# Patient Record
Sex: Male | Born: 1945 | Race: White | Hispanic: No | Smoking: Former smoker
Health system: Southern US, Community
[De-identification: ages and names within clinical notes are randomized; demographics above are authoritative.]

## PROBLEM LIST (undated history)

## (undated) DIAGNOSIS — E7800 Pure hypercholesterolemia, unspecified: Secondary | ICD-10-CM

## (undated) DIAGNOSIS — J9611 Chronic respiratory failure with hypoxia: Secondary | ICD-10-CM

## (undated) DIAGNOSIS — J449 Chronic obstructive pulmonary disease, unspecified: Secondary | ICD-10-CM

## (undated) DIAGNOSIS — G4733 Obstructive sleep apnea (adult) (pediatric): Secondary | ICD-10-CM

## (undated) DIAGNOSIS — E119 Type 2 diabetes mellitus without complications: Secondary | ICD-10-CM

## (undated) DIAGNOSIS — K219 Gastro-esophageal reflux disease without esophagitis: Secondary | ICD-10-CM

## (undated) HISTORY — PX: STOMACH SURGERY: SHX791

## (undated) HISTORY — DX: Gastro-esophageal reflux disease without esophagitis: K21.9

## (undated) HISTORY — DX: Type 2 diabetes mellitus without complications: E11.9

## (undated) HISTORY — DX: Chronic respiratory failure with hypoxia: J96.11

## (undated) HISTORY — DX: Obstructive sleep apnea (adult) (pediatric): G47.33

## (undated) HISTORY — DX: Chronic obstructive pulmonary disease, unspecified: J44.9

## (undated) HISTORY — DX: Pure hypercholesterolemia, unspecified: E78.00

---

## 2015-05-01 ENCOUNTER — Other Ambulatory Visit: Payer: Self-pay

## 2015-05-01 ENCOUNTER — Encounter: Payer: Self-pay | Admitting: Pulmonary Disease

## 2015-05-03 ENCOUNTER — Telehealth: Payer: Self-pay | Admitting: Pulmonary Disease

## 2015-05-03 NOTE — Telephone Encounter (Signed)
No data to review.

## 2015-05-04 ENCOUNTER — Ambulatory Visit (INDEPENDENT_AMBULATORY_CARE_PROVIDER_SITE_OTHER): Payer: No Typology Code available for payment source | Admitting: Pulmonary Disease

## 2015-05-04 ENCOUNTER — Other Ambulatory Visit: Payer: No Typology Code available for payment source

## 2015-05-04 ENCOUNTER — Ambulatory Visit (INDEPENDENT_AMBULATORY_CARE_PROVIDER_SITE_OTHER)
Admission: RE | Admit: 2015-05-04 | Discharge: 2015-05-04 | Disposition: A | Discharge status: Home / Self Care | Payer: No Typology Code available for payment source | Source: Ambulatory Visit | Attending: Pulmonary Disease | Admitting: Pulmonary Disease

## 2015-05-04 ENCOUNTER — Encounter (INDEPENDENT_AMBULATORY_CARE_PROVIDER_SITE_OTHER): Payer: Self-pay

## 2015-05-04 ENCOUNTER — Encounter: Payer: Self-pay | Admitting: Pulmonary Disease

## 2015-05-04 ENCOUNTER — Telehealth: Payer: Self-pay | Admitting: Pulmonary Disease

## 2015-05-04 VITALS — BP 124/70 | HR 100 | Temp 96.9°F | Ht 72.0 in | Wt 202.8 lb

## 2015-05-04 DIAGNOSIS — J449 Chronic obstructive pulmonary disease, unspecified: Secondary | ICD-10-CM | POA: Diagnosis not present

## 2015-05-04 DIAGNOSIS — Z77090 Contact with and (suspected) exposure to asbestos: Secondary | ICD-10-CM

## 2015-05-04 DIAGNOSIS — G4733 Obstructive sleep apnea (adult) (pediatric): Secondary | ICD-10-CM | POA: Insufficient documentation

## 2015-05-04 DIAGNOSIS — E119 Type 2 diabetes mellitus without complications: Secondary | ICD-10-CM | POA: Insufficient documentation

## 2015-05-04 DIAGNOSIS — K219 Gastro-esophageal reflux disease without esophagitis: Secondary | ICD-10-CM | POA: Insufficient documentation

## 2015-05-04 DIAGNOSIS — E785 Hyperlipidemia, unspecified: Secondary | ICD-10-CM | POA: Insufficient documentation

## 2015-05-04 DIAGNOSIS — J9610 Chronic respiratory failure, unspecified whether with hypoxia or hypercapnia: Secondary | ICD-10-CM | POA: Insufficient documentation

## 2015-05-04 DIAGNOSIS — J9611 Chronic respiratory failure with hypoxia: Secondary | ICD-10-CM | POA: Diagnosis not present

## 2015-05-04 DIAGNOSIS — Z9989 Dependence on other enabling machines and devices: Secondary | ICD-10-CM

## 2015-05-04 DIAGNOSIS — I4891 Unspecified atrial fibrillation: Secondary | ICD-10-CM | POA: Insufficient documentation

## 2015-05-04 NOTE — Telephone Encounter (Signed)
LABS 02/23/15 CBC:  14.6 / 16.1 / 48.8 / 296 BMP:  136/4.5/101/26/13/245  02/21/15 POC Glucose:  290 POC Creatinine:  0.7 UA:  Clear / SG 1.025 / pH 6.0 / WBC <1 / RBC 1  02/17/15 BNP:  69.63 PSA:  1.12 INR:  2.1  12/24/14 INR:  1.6

## 2015-05-04 NOTE — Patient Instructions (Addendum)
   Continue using your inhalers as prescribed  Continue using your oxygen as prescribed  We will schedule a breathing and walking test at your next appointment  Please call my office if you have any new breathing problems before your next appointment which we will schedule for 3 months from now.  TESTS ORDERED: 1. Alpha-1 Antitrypsin Phenotype today 2. CXR PA/LAT today 3. Full PFTs at next appointment 4. Six-Minute Walk Tests w/ Oxygen Titration at next appointment 5. Referral to Lung Cancer Screening Coordinator Evette Doffing(Sarah G.) 6. Pulmonary Rehab Referral

## 2015-05-04 NOTE — Progress Notes (Signed)
Subjective:    Patient ID: Scott Wall, male    DOB: 09/08/1945, 70 y.o.   MRN: [id number]  HPI He reports he was diagnosed with COPD after a breathing test a few years ago. He reports he has been on oxygen since June 2016 after he was hospitalized with organ failure. He admits he is a mouth breather. Denies any history of recurrent bronchitis. He has had pneumonia once previously 8 years ago. Denies any history of childhood asthma. Denies any allergies, sinus congestion, or drainage. Denies any coughing or wheezing. Denies any history of recurrent exacerbations treated with steroids or antibiotics. He does have dyspnea on exertion only. Denies any chest pain or pressure. Denies any palpitations. Denies any recent reflux or dyspepsia but does have a history of it prior to some abdominal surgery. He is currently on Spiriva Respimat and has been on it for 5 months. Reports he has never needed to use his rescue inhaler. His physician thought he may benefit from Pulmonary Rehab so he was referred to our office.   Review of Systems No fever or sweats. He reports he chronically stays "cold". No adenopathy in his neck, groin or axilla. A pertinent 14 point review of systems is negative except as per the history of presenting illness.  No Known Allergies  Current Outpatient Prescriptions on File Prior to Visit  Medication Sig Dispense Refill  . albuterol (PROVENTIL HFA;VENTOLIN HFA) 108 (90 Base) MCG/ACT inhaler Inhale 2 puffs into the lungs every 6 (six) hours as needed for wheezing or shortness of breath.    Marland Kitchen. apixaban (ELIQUIS) 5 MG TABS tablet Take 5 mg by mouth 2 (two) times daily.    Marland Kitchen. atorvastatin (LIPITOR) 20 MG tablet Take 10 mg by mouth daily.     . carboxymethylcellulose (REFRESH PLUS) 0.5 % SOLN 1 drop 3 (three) times daily as needed.    . digoxin (LANOXIN) 0.25 MG tablet Take 0.25 mg by mouth daily.    Marland Kitchen. diltiazem (DILACOR XR) 240 MG 24 hr capsule Take 240 mg by mouth daily.    .  furosemide (LASIX) 40 MG tablet Take 40 mg by mouth 2 (two) times daily.     . hydrocortisone 2.5 % cream Apply 1 application topically 2 (two) times daily as needed.    . potassium chloride SA (K-DUR,KLOR-CON) 20 MEQ tablet Take 20 mEq by mouth daily.    . ranitidine (ZANTAC) 150 MG capsule Take 150 mg by mouth 2 (two) times daily.    . Tiotropium Bromide-Olodaterol 2.5-2.5 MCG/ACT AERS Inhale 2 puffs into the lungs daily.    . trospium (SANCTURA) 20 MG tablet Take 20 mg by mouth at bedtime. Reported on 05/04/2015     No current facility-administered medications on file prior to visit.    Past Medical History  Diagnosis Date  . COPD (chronic obstructive pulmonary disease) (HCC)   . Diabetes (HCC)   . Hypercholesteremia   . OSA (obstructive sleep apnea)   . GERD (gastroesophageal reflux disease)   . Chronic respiratory failure with hypoxia [facility](HCC)     Past Surgical History  Procedure Laterality Date  . Stomach surgery      Family History  Problem Relation Age of Onset  . Diabetes Father   . Cancer Maternal Grandmother   . Lung disease Neg Hx     Social History   Social History  . Marital Status: Divorced    Spouse Name: N/A  . Number of Children: N/A  . Years of Education:  N/A   Occupational History  . Semi-Retired     [employer]   Social History Main Topics  . Smoking status: Former Smoker -- 1.50 packs/day for 45 years    Types: Cigarettes    Quit date: 02/01/2007  . Smokeless tobacco: Never Used  . Alcohol Use: No  . Drug Use: No  . Sexual Activity: Not Asked   Other Topics Concern  . None   Social History Narrative   Originally from [place]. Grew up in [place]. Moved to [place] in 1961. He served in [employer] for 20 years. He was in [place]. 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 Previously has lived in [place], [place], [place], [place]. No  pets currently. No bird or mold exposure. Does have prior exposure to asbestos.       Objective:   Physical Exam BP 124/70 mmHg  Pulse 100  Temp(Src) 96.9 F (36.1 C) (Oral)  Ht 6' (1.829 m)  Wt 202 lb 12.8 oz (91.989 kg)  BMI 27.50 kg/m2  SpO2 91% General:  Awake. Alert. No acute distress.   Integument:  Warm & dry. No rash on exposed skin. No bruising. Lymphatics:  No appreciated cervical or supraclavicular lymphadenoapthy. HEENT:  Moist mucus membranes. No oral ulcers. No scleral injection or icterus. Rhinophyma noted. Cardiovascular:  Regular rate. No edema. No appreciable JVD.  Pulmonary:  Good aeration & clear to auscultation bilaterally. Symmetric chest wall expansion. No accessory muscle use. Abdomen: Soft. Normal bowel sounds. Nondistended. Grossly nontender. Musculoskeletal:  Normal bulk and tone. Hand grip strength 5/5 bilaterally. No joint deformity or effusion appreciated. Neurological:  CN 2-12 grossly in tact. No meningismus. Moving all 4 extremities equally. Symmetric brachioradialis deep tendon reflexes. Psychiatric:  Mood and affect congruent. Speech normal rhythm, rate & tone.   LABS 02/23/15 CBC: 14.6 / 16.1 / 48.8 / 296 BMP: 136/4.5/101/26/13/245  02/21/15 POC Glucose: 290 POC Creatinine: 0.7 UA: Clear / SG 1.025 / pH 6.0 / WBC <1 / RBC 1  02/17/15 BNP: 69.63 PSA: 1.12 INR: 2.1  12/24/14 INR: 1.6    Assessment & Plan:  70 year old male with known COPD on chronic oxygen therapy.The severity of the patient's underlying COPD is unclear at this time. He does chronically use oxygen and is compliant. He is also extremely compliant with his home CPAP therapy for OSA treatment. I suspect his COPD is likely secured to his chronic tobacco use but certainly screening for alpha-1 antitrypsin deficiency is reasonable. He does not seem to have the chronic bronchitis phenotype of underlying COPD. With his prior exposure to asbestos I feel that screening for lung  cancer is essential as well as screening for asbestosis with chest x-ray imaging today. I instructed the patient contact my office if he had any new breathing problems or questions before his next appointment.  1. COPD: Screening for alpha-1 antitrypsin deficiency today. Continuing patient on Spiriva Respimat. Obtaining pulmonary function testing from his primary care physician's office. Checking full pulmonary function testing at follow-up appointment. Referring patient to pulmonary rehabilitation. 2. Chronic hypoxic respiratory failure: Continuing patient on oxygen therapy as previously prescribed. Checking 6 minute walk test with oxygen titration at next appointment. 3. GERD: Currently asymptomatic on Zantac. No changes. 4. OSA: Patient continuing on CPAP therapy indefinitely. Reports good compliance. 5. History of asbestos exposure: Checking chest x-ray PA/LAT today & full pulmonary  function testing at next appointment. 6. History of tobacco use: Patient willing to discuss low-dose chest CT scan for lung cancer screening. Referring to her lung cancer screening coordinator. 7. Follow-up: Patient to return to clinic in 3 months or sooner if needed.  Donna Christen Jamison Neighbor, M.D. [facility] Pulmonary & Critical Care Pager:  [phone] After 3pm or if no response, call [phone] 2:16 PM 05/04/2015

## 2015-05-05 ENCOUNTER — Telehealth: Payer: Self-pay | Admitting: Pulmonary Disease

## 2015-05-05 NOTE — Telephone Encounter (Signed)
Notes Recorded by Roslynn AmbleJennings E Nestor, MD on 05/04/2015 at 5:54 PM Please call Mr. Guadlupe SpanishGroves and let him know I looked at his chest x-ray. There is suggestion of emphysema on the x-ray but otherwise no evidence of asbestosis. Thank you. --------------------------- Spoke with pt, aware of results/recs.  Nothing further needed.

## 2015-05-05 NOTE — Progress Notes (Signed)
Quick Note:  Called, spoke with pt's wife. She will ask pt to call office back. ______

## 2015-05-06 ENCOUNTER — Telehealth: Payer: Self-pay | Admitting: Pulmonary Disease

## 2015-05-06 NOTE — Telephone Encounter (Signed)
Called and spoke with pt. He states that he needs a [place] [facility]. I explained to him that the [facility] contacted Cordelia PenSherry at our office and informed her that the [facility] will need to send the referral. He states he will contact the [facility] for clarification. Nothing further needed at this time.

## 2015-05-08 LAB — ALPHA-1 ANTITRYPSIN PHENOTYPE: A-1 Antitrypsin: 158 mg/dL (ref 83–199)

## 2015-05-12 ENCOUNTER — Telehealth: Payer: Self-pay | Admitting: Acute Care

## 2015-05-12 NOTE — Telephone Encounter (Signed)
Called spoke with patient to discuss Lung Cancer Screening program.  Pt stated that he is unsure about committing to program due to [facility] with the [facility] and kept referring to going [facility] (DrNestor did refer patient to [facility] but was able to help patient to clearly differentiate between the two programs).  Pt is due to see DrNestor again on 7.14.17 and he reported he will discuss program with DrNestor at that time.  Notification sent to DrNestor regarding the above.

## 2015-05-18 ENCOUNTER — Telehealth: Payer: Self-pay | Admitting: Pulmonary Disease

## 2015-05-18 NOTE — Telephone Encounter (Signed)
[facility] has [facility] [place].  Pt was to call the [facility] & ask them to send referral to [facility].  Tressa[facility] phone [facility] at [facility] is [phone].  Pt was made aware he was to call & have referral from [facility] sent to them before he could be scheduled.

## 2015-05-18 NOTE — Telephone Encounter (Signed)
There was no number given to call back Scott Wall  Looks like [facility] about the [facility] on 05/06/15  [facility], is there something that we need to do?  Please advise, thanks

## 2015-05-19 NOTE — Telephone Encounter (Signed)
Spoke with Olivia Mackieressa at [facility]- states that they need a [facility], as well as complete H&P and PFT notes before they can schedule pt with pulmonary rehab.  Also, states that our referral is for a PFT and not for pulmonary rehab.  I advised that we ordered a referral to pulmonary rehab, but Olivia Mackieressa states this is not so.  I do not see in JN's last ov note why this referral was sent [facility] instead of done locally here.  JN please advise if there is a reason this has been sent to [facility] instead of being done here through [facility].   Thanks!

## 2015-05-20 NOTE — Telephone Encounter (Signed)
The [facility] because he said it would be closer to where he lives. Not sure honestly why he was referred to our office instead, maybe an issue of appointment timing or primary care physician preference. Honestly don't care where he does it but it would benefit him so let's try not to make it hard on him for the drive. Thanks.

## 2015-05-20 NOTE — Telephone Encounter (Signed)
I called and explained all of this to the pt  He will reach out to his [facility] doc for referral and then we will send his PFT and other records once this has been done

## 2015-08-14 ENCOUNTER — Ambulatory Visit: Payer: No Typology Code available for payment source

## 2015-08-14 ENCOUNTER — Ambulatory Visit: Payer: No Typology Code available for payment source | Admitting: Pulmonary Disease

## 2017-01-04 IMAGING — DX DG CHEST 2V
2 series · 2 of 2 positions shown · non-contrast
Comparison: None.

CLINICAL DATA: Chronic obstructive pulmonary disease.

EXAM:
CHEST  2 VIEW

[chest pa]
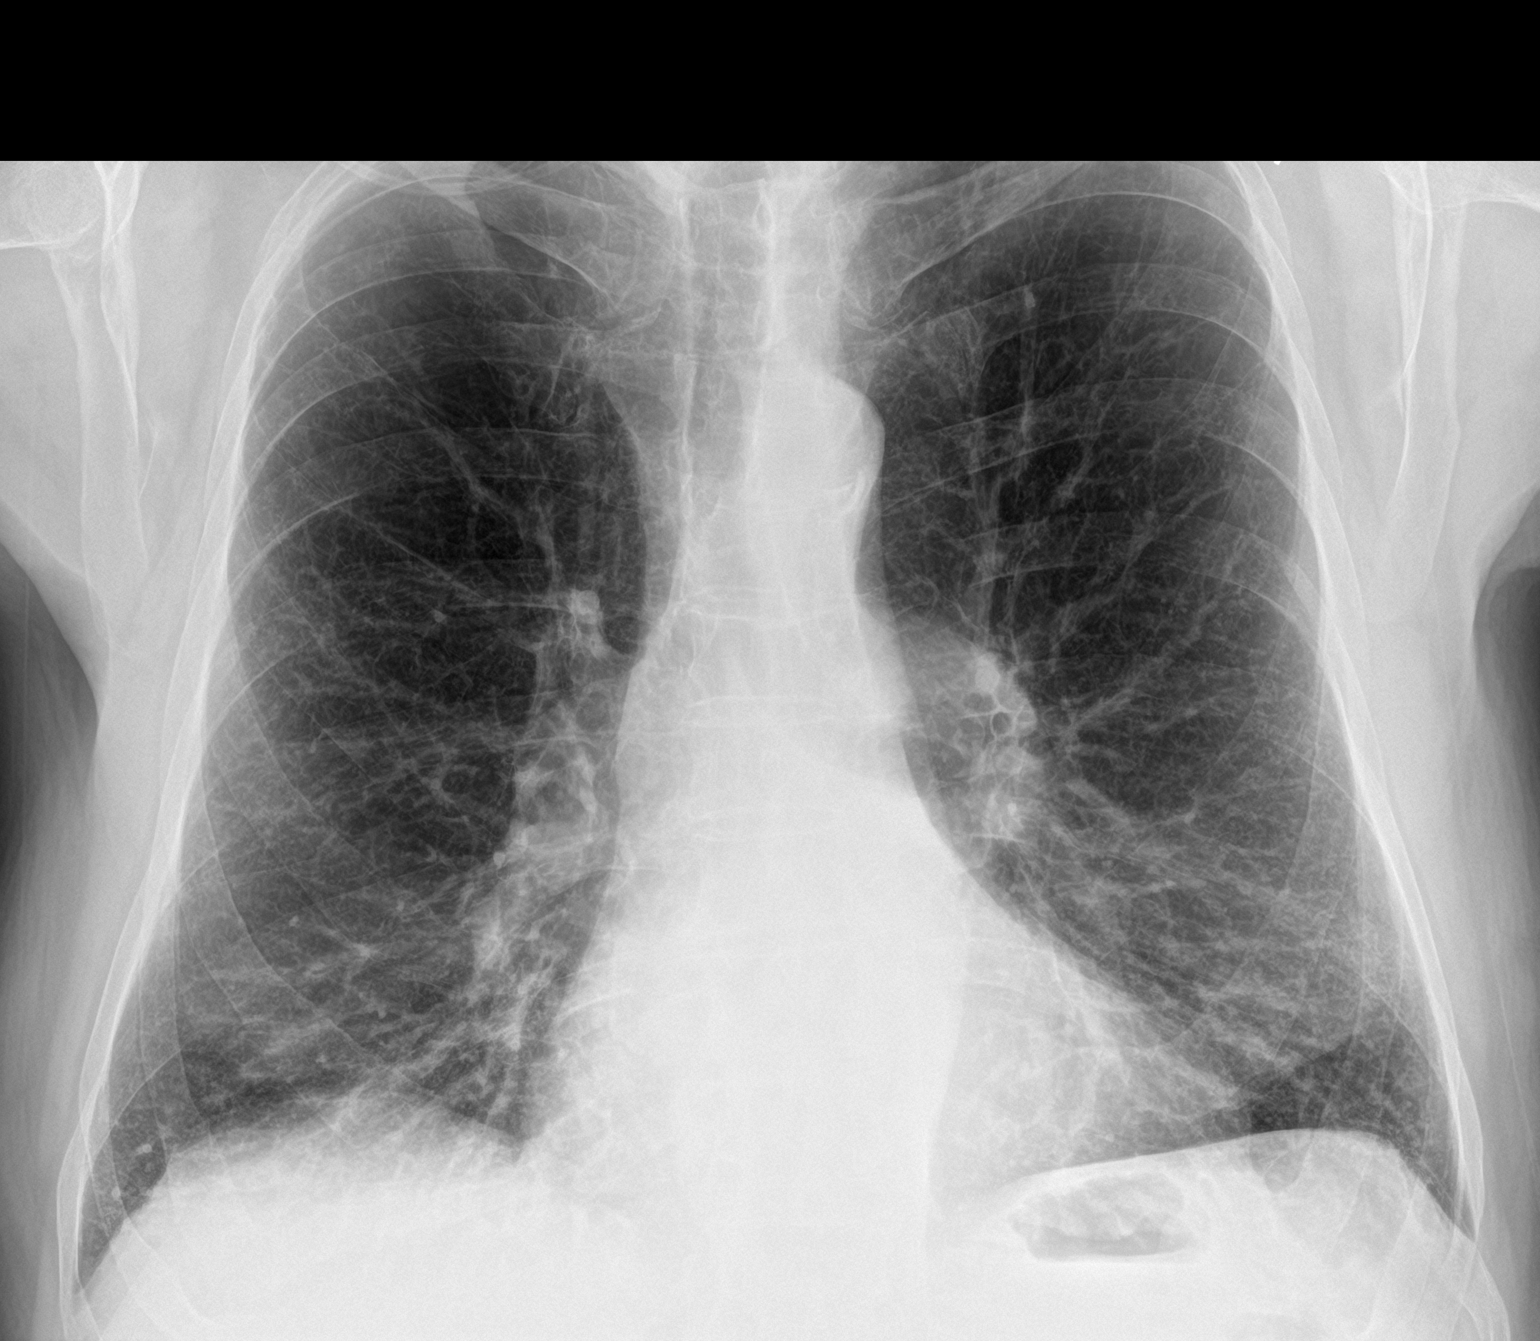

[chest lat]
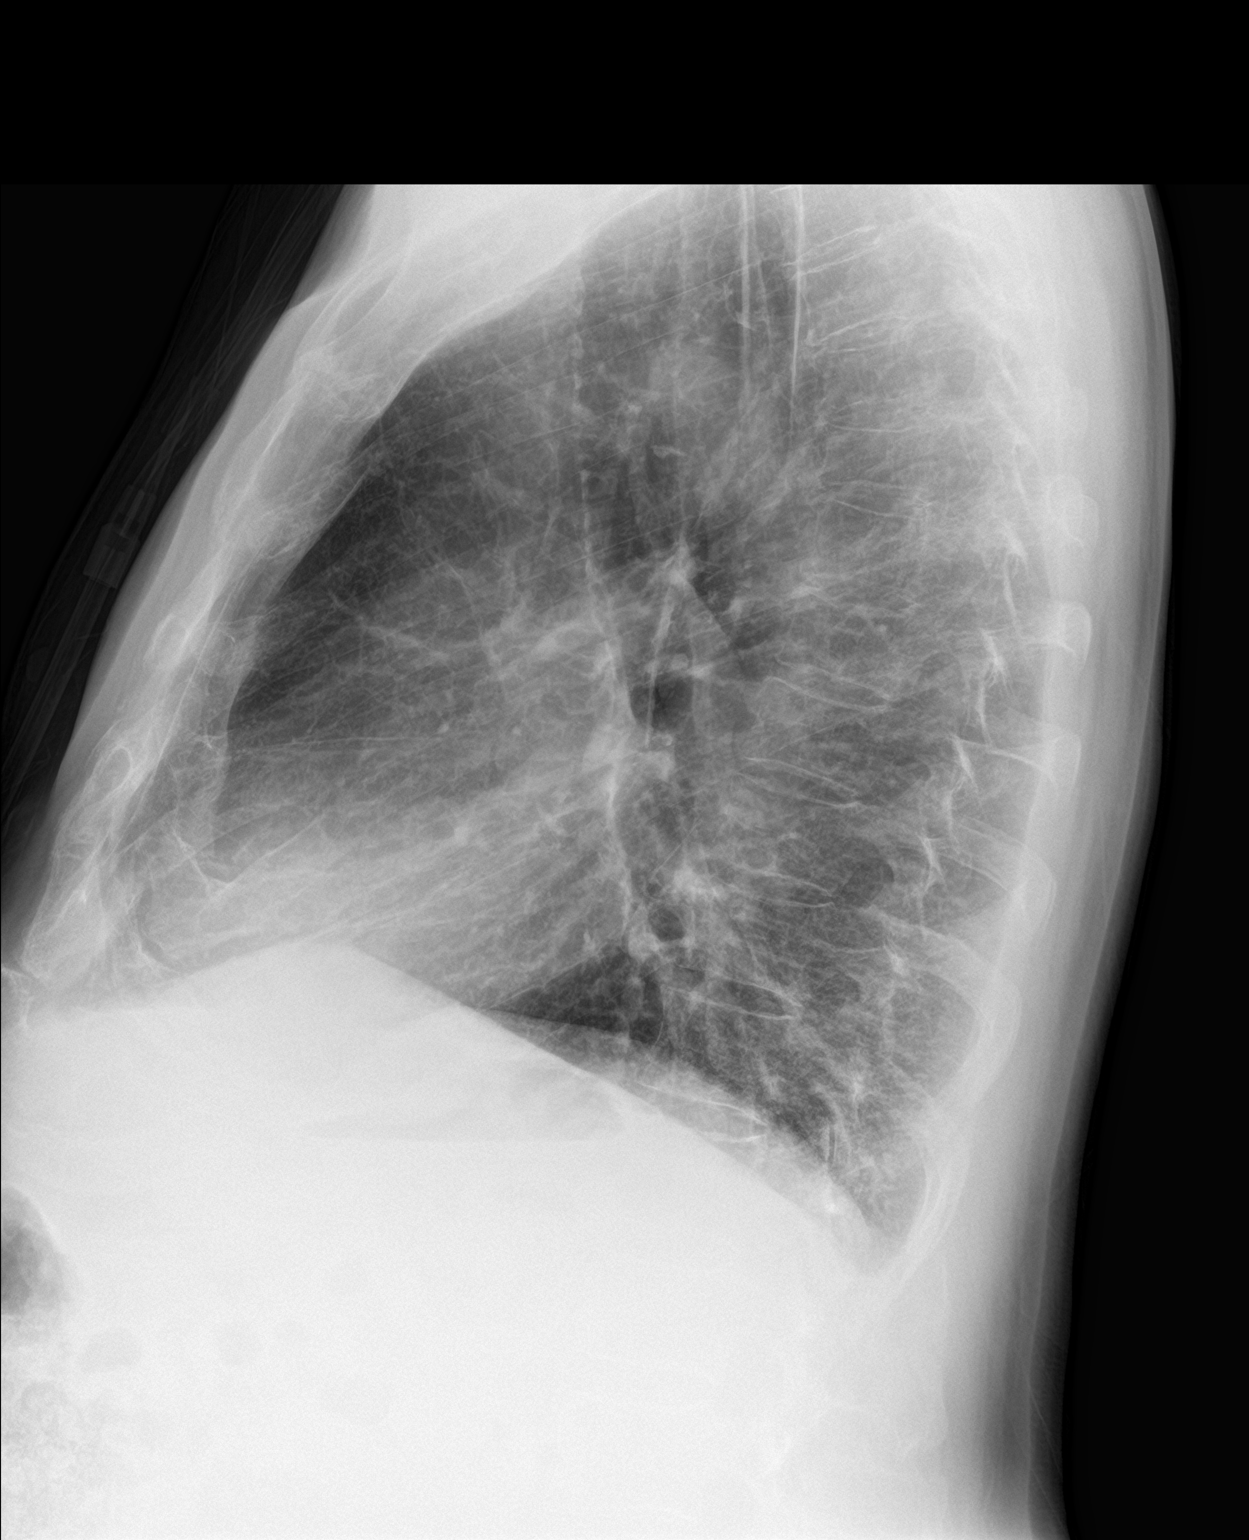

[2 of 2 positions shown; findings below may reference images not displayed]

FINDINGS: The heart size and mediastinal contours are within normal limits. No
pneumothorax or pleural effusion is noted. Emphysematous changes
noted in the upper lobes bilaterally. Probable bibasilar scarring is
noted. The visualized skeletal structures are unremarkable.
IMPRESSION: Findings consistent chronic obstructive pulmonary disease. No acute
cardiopulmonary abnormality seen.

## 2017-08-31 DEATH — deceased
